# Patient Record
Sex: Female | Born: 1980 | Race: White | Hispanic: No | Marital: Married | Smoking: Current every day smoker
Health system: Southern US, Community
[De-identification: ages and names within clinical notes are randomized; demographics above are authoritative.]

## PROBLEM LIST (undated history)

## (undated) DIAGNOSIS — F419 Anxiety disorder, unspecified: Secondary | ICD-10-CM

## (undated) DIAGNOSIS — F32A Depression, unspecified: Secondary | ICD-10-CM

## (undated) DIAGNOSIS — I10 Essential (primary) hypertension: Secondary | ICD-10-CM

## (undated) DIAGNOSIS — F1010 Alcohol abuse, uncomplicated: Secondary | ICD-10-CM

## (undated) HISTORY — DX: Anxiety disorder, unspecified: F41.9

## (undated) HISTORY — DX: Depression, unspecified: F32.A

## (undated) HISTORY — DX: Essential (primary) hypertension: I10

## (undated) HISTORY — DX: Alcohol abuse, uncomplicated: F10.10

---

## 2020-05-10 ENCOUNTER — Encounter (HOSPITAL_COMMUNITY): Payer: Self-pay | Admitting: Emergency Medicine

## 2020-05-10 ENCOUNTER — Emergency Department (HOSPITAL_COMMUNITY)
Admission: EM | Admit: 2020-05-10 | Discharge: 2020-05-10 | Disposition: A | Discharge status: Home / Self Care | Payer: BC Managed Care – PPO | Attending: Emergency Medicine | Admitting: Emergency Medicine

## 2020-05-10 ENCOUNTER — Emergency Department (HOSPITAL_COMMUNITY): Payer: BC Managed Care – PPO

## 2020-05-10 DIAGNOSIS — R112 Nausea with vomiting, unspecified: Secondary | ICD-10-CM | POA: Insufficient documentation

## 2020-05-10 DIAGNOSIS — R1013 Epigastric pain: Secondary | ICD-10-CM | POA: Diagnosis present

## 2020-05-10 DIAGNOSIS — R1011 Right upper quadrant pain: Secondary | ICD-10-CM | POA: Insufficient documentation

## 2020-05-10 LAB — I-STAT BETA HCG BLOOD, ED (MC, WL, AP ONLY): I-stat hCG, quantitative: <5 m[IU]/mL (ref <5)

## 2020-05-10 LAB — CBC
HCT: 49 % — ABNORMAL HIGH (ref 36.0–46.0)
Hemoglobin: 17.6 g/dL — ABNORMAL HIGH (ref 12.0–15.0)
MCH: 38.3 pg — ABNORMAL HIGH (ref 26.0–34.0)
MCHC: 35.9 g/dL (ref 30.0–36.0)
MCV: 106.5 fL — ABNORMAL HIGH (ref 80.0–100.0)
Platelets: 291 10*3/uL (ref 150–400)
RBC: 4.6 MIL/uL (ref 3.87–5.11)
RDW: 13.2 % (ref 11.5–15.5)
WBC: 8.1 10*3/uL (ref 4.0–10.5)
nRBC: 0.2 % (ref 0.0–0.2)

## 2020-05-10 LAB — COMPREHENSIVE METABOLIC PANEL
ALT: 152 U/L — ABNORMAL HIGH (ref 0–44)
AST: 179 U/L — ABNORMAL HIGH (ref 15–41)
Albumin: 4 g/dL (ref 3.5–5.0)
Alkaline Phosphatase: 81 U/L (ref 38–126)
Anion gap: 17 — ABNORMAL HIGH (ref 5–15)
BUN: 11 mg/dL (ref 6–20)
CO2: 24 mmol/L (ref 22–32)
Calcium: 9.2 mg/dL (ref 8.9–10.3)
Chloride: 101 mmol/L (ref 98–111)
Creatinine, Ser: 0.85 mg/dL (ref 0.44–1.00)
GFR, Estimated: >60 mL/min (ref >60)
Glucose, Bld: 134 mg/dL — ABNORMAL HIGH (ref 70–99)
Potassium: 3.5 mmol/L (ref 3.5–5.1)
Sodium: 142 mmol/L (ref 135–145)
Total Bilirubin: 2 mg/dL — ABNORMAL HIGH (ref 0.3–1.2)
Total Protein: 7.9 g/dL (ref 6.5–8.1)

## 2020-05-10 LAB — HEPATITIS PANEL, ACUTE
HCV Ab: NONREACTIVE
Hep A IgM: NONREACTIVE
Hep B C IgM: NONREACTIVE
Hepatitis B Surface Ag: NONREACTIVE

## 2020-05-10 LAB — LIPASE, BLOOD: Lipase: 35 U/L (ref 11–51)

## 2020-05-10 LAB — ETHANOL: Alcohol, Ethyl (B): <10 mg/dL (ref <10)

## 2020-05-10 LAB — ACETAMINOPHEN LEVEL: Acetaminophen (Tylenol), Serum: <10 ug/mL — ABNORMAL LOW (ref 10–30)

## 2020-05-10 MED ORDER — LACTATED RINGERS IV BOLUS
1000.0000 mL | Freq: Once | INTRAVENOUS | Status: AC
  Administered 2020-05-10: 1000 mL via INTRAVENOUS

## 2020-05-10 MED ORDER — PANTOPRAZOLE SODIUM 20 MG PO TBEC: 20.0000 mg | DELAYED_RELEASE_TABLET | Freq: Every day | ORAL | 1 refills | Status: DC

## 2020-05-10 MED ORDER — ONDANSETRON 4 MG PO TBDP
4.0000 mg | ORAL_TABLET | Freq: Once | ORAL | Status: AC | PRN
  Administered 2020-05-10: 4 mg via ORAL
  Filled 2020-05-10: qty 1

## 2020-05-10 MED ORDER — IOHEXOL 300 MG/ML  SOLN
100.0000 mL | Freq: Once | INTRAMUSCULAR | Status: AC | PRN
  Administered 2020-05-10: 100 mL via INTRAVENOUS

## 2020-05-10 MED ORDER — ONDANSETRON 8 MG PO TBDP: 8.0000 mg | ORAL_TABLET | Freq: Three times a day (TID) | ORAL | 0 refills | Status: AC | PRN

## 2020-05-10 MED ORDER — MORPHINE SULFATE (PF) 4 MG/ML IV SOLN
4.0000 mg | Freq: Once | INTRAVENOUS | Status: AC
  Administered 2020-05-10: 4 mg via INTRAVENOUS
  Filled 2020-05-10: qty 1

## 2020-05-10 MED ORDER — SUCRALFATE 1 G PO TABS: 1.0000 g | ORAL_TABLET | Freq: Three times a day (TID) | ORAL | 0 refills | Status: DC

## 2020-05-10 MED ORDER — FAMOTIDINE 20 MG PO TABS: 20.0000 mg | ORAL_TABLET | Freq: Two times a day (BID) | ORAL | 0 refills | Status: DC

## 2020-05-10 MED ORDER — METOCLOPRAMIDE HCL 5 MG/ML IJ SOLN
10.0000 mg | Freq: Once | INTRAMUSCULAR | Status: AC
  Administered 2020-05-10: 10 mg via INTRAVENOUS
  Filled 2020-05-10: qty 2

## 2020-05-10 NOTE — Discharge Instructions (Addendum)
°  Diet: Start with a clear liquid diet, progressed to a full liquid diet, and then bland solids as you are able. Please adhere to the enclosed dietary suggestions.  In general, avoid NSAIDs (i.e. ibuprofen, naproxen, etc.), caffeine, alcohol, spicy foods, fatty foods, or any other foods that seem to cause your symptoms to arise.  Protonix: Take this medication daily, 20-30 minutes prior to your first meal, for the next 8 weeks.  Continue to take this medication even if you begin to feel better.  Pepcid: Take this medication twice a day for the next 5 days.  Sucralfate: Sucralfate (generic for Carafate) is meant to soothe symptoms of abdominal discomfort and reflux.  Follow-up: Please follow-up with your primary care provider on this matter. You were noted to have mild elevations in your liver enzymes.  Please follow-up on these through a gastroenterologist.  Call to make an appointment.  Return: Return to the ED for significantly worsening symptoms, persistent vomiting, persistent fever, vomiting blood, blood in the stools, dark stools, or any other major concerns.  There are some lab values pending at time of discharge.  These results may be reviewed through online MyChart account.  For prescription assistance, may try using prescription discount sites or apps, such as goodrx.com

## 2020-05-10 NOTE — ED Notes (Signed)
Patient transported to CT 

## 2020-05-10 NOTE — ED Notes (Signed)
Pt in CT.

## 2020-05-10 NOTE — ED Notes (Signed)
Acetaminophen level drawn and sent to lab.

## 2020-05-10 NOTE — ED Triage Notes (Signed)
Patient complains of abdominal pain, nausea, and vomiting, dehydration x2 days. Patient states today her hands started cramping. Patient alert, oriented, and ambulatory at this time.

## 2020-05-10 NOTE — Medical Student Note (Incomplete)
[facility]-EMERGENCY DEPT Provider Student Note For educational purposes for Medical, PA and NP students only and not part of the legal medical record.   CSN: [id number] Arrival date & time: 05/10/20  6222      History   Chief Complaint Chief Complaint  Patient presents with  . Abdominal Pain    HPI Vickie Mccarthy is a 40 y.o. female with PMHx hypertension who presents to ED for complain of abdominal pain.  States that she has been "in and out of [facility]" for a UTI about 1.5 months ago. States she feels like she had the UTI until last week.  She was taking an antibiotic until Monday when she started having abdominal pain. She complains of two days of umbilical and epigastric 10/10 pain. Describes it as heart burn. Endorses nausea, vomiting and diarrhea. Tried tylenol cold and flu without relief of symptoms. She is retching multiple times while examining her. She also endorses decreased urine output. States she urinated a small amount this morning but "that's it over the past day or so." Denies fevers, chest pain, shortness of breath or sick contacts. Denies burning on urination. LMP 1.5 months ago.   Additionally, her other main complaint is severe hand pain. She describes it as 15/10 pain that feels like they "are on fire" and that they are numb. States the numbness radiates up her arms.   Endorses tobacco and marijuana use. No IVDU or other illicit drug abuse. States she drinks "a couple of glasses of wine a night," but has not had alcohol since vomiting has started. Denies withdrawal symptoms but states she has never withdrawn or had withdrawal seizures from alcohol before.   History reviewed. No pertinent past medical history.  There are no problems to display for this patient.   History reviewed. No pertinent surgical history.  OB History   No obstetric history on file.    Home Medications    Prior to Admission medications   Not on File    Family History No family  history on file.  Social History    Allergies   Patient has no known allergies.   Review of Systems Review of Systems  Constitutional: Positive for appetite change.  HENT: Negative for sore throat.   Eyes: Negative for discharge and visual disturbance.  Respiratory: Negative for chest tightness and shortness of breath.   Cardiovascular: Negative for chest pain and palpitations.  Gastrointestinal: Positive for abdominal pain, diarrhea, nausea and vomiting. Negative for abdominal distention and blood in stool.  Endocrine: Negative for polyuria.  Genitourinary: Positive for decreased urine volume. Negative for difficulty urinating, dysuria and flank pain.  Musculoskeletal: Negative.   Skin: Negative.   Allergic/Immunologic: Negative.   Neurological: Positive for numbness. Negative for dizziness, syncope, weakness and light-headedness.       Hands  Hematological: Negative.   Psychiatric/Behavioral: Negative.      Physical Exam Updated Vital Signs BP (!) 131/93 (BP Location: Right Arm)   Pulse 78   Temp 98.6 F (37 C) (Oral)   Resp (!) 22   SpO2 100%   Physical Exam Constitutional:      Appearance: She is ill-appearing.  HENT:     Head: Normocephalic and atraumatic.  Eyes:     Extraocular Movements: Extraocular movements intact.     Pupils: Pupils are equal, round, and reactive to light.  Cardiovascular:     Rate and Rhythm: Normal rate and regular rhythm.     Heart sounds: Normal heart sounds.  Pulmonary:     Effort: Pulmonary effort is normal.  Abdominal:     General: Abdomen is protuberant. Bowel sounds are decreased. There is no distension.     Palpations: Abdomen is soft. There is no shifting dullness.     Tenderness: There is abdominal tenderness in the right upper quadrant and epigastric area. There is no rebound. Positive signs include Murphy's sign.  Skin:    General: Skin is warm and dry.     Capillary Refill: Capillary refill takes less than 2 seconds.   Neurological:     General: No focal deficit present.     Mental Status: She is alert.     Cranial Nerves: Cranial nerves are intact.     Sensory: Sensation is intact.     Motor: Motor function is intact.     Coordination: Coordination is intact.  Psychiatric:        Mood and Affect: Mood is anxious.        Behavior: Behavior is cooperative.    ED Treatments / Results  Labs (all labs ordered are listed, but only abnormal results are displayed) Labs Reviewed  COMPREHENSIVE METABOLIC PANEL - Abnormal; Notable for the following components:      Result Value   Glucose, Bld 134 (*)    AST 179 (*)    ALT 152 (*)    Total Bilirubin 2.0 (*)    Anion gap 17 (*)    All other components within normal limits  CBC - Abnormal; Notable for the following components:   Hemoglobin 17.6 (*)    HCT 49.0 (*)    MCV 106.5 (*)    MCH 38.3 (*)    All other components within normal limits  LIPASE, BLOOD  ETHANOL  URINALYSIS, ROUTINE W REFLEX MICROSCOPIC  HEPATITIS PANEL, ACUTE  ACETAMINOPHEN LEVEL  I-STAT BETA HCG BLOOD, ED ([facility], [facility], [facility] ONLY)    EKG  Radiology US Abdomen Limited RUQ (LIVER/GB)  Result Date: 05/10/2020 CLINICAL DATA:  Epigastric pain EXAM: ULTRASOUND ABDOMEN LIMITED RIGHT UPPER QUADRANT COMPARISON:  None. FINDINGS: Gallbladder: No gallstones or wall thickening visualized. No sonographic Murphy sign noted by sonographer. Common bile duct: Diameter: Normal caliber, 4 mm Liver: Heterogeneous, diffusely increased echotexture suggesting fatty infiltration. Hypoechoic area adjacent to the gallbladder fossa may reflect focal fatty sparing. Portal vein is patent on color Doppler imaging with normal direction of blood flow towards the liver. Other: None. IMPRESSION: Hepatic steatosis. No acute findings. Electronically Signed   By: Charlett Nose M.D.   On: 05/10/2020 10:44   Procedures Procedures (including critical care time)  Medications Ordered in ED Medications  ondansetron  (ZOFRAN-ODT) disintegrating tablet 4 mg (4 mg Oral Given 05/10/20 0830)  metoCLOPramide (REGLAN) injection 10 mg (10 mg Intravenous Given 05/10/20 0944)  morphine 4 MG/ML injection 4 mg (4 mg Intravenous Given 05/10/20 1030)  lactated ringers bolus 1,000 mL (1,000 mLs Intravenous New Bag/Given 05/10/20 1029)   Initial Impression / Assessment and Plan / ED Course  I have reviewed the triage vital signs and the nursing notes.  Pertinent labs & imaging results that were available during my care of the patient were reviewed by me and considered in my medical decision making (see chart for details).  Vickie Mccarthy is a 39yoF with PMH and HPI listed above. Differential diagnosis is broad includes pancreatitis, PUD, cholecystitis, gastroenteritis, alcohol withdrawal.  [place] RUQ, CBC, CMP, lipase, beta hcg, ethanol, UA.  [place] RUQ negative for gallstones. Hepatic steatosis noted. WBC wnl, LFTs elevated,  T.bili 2, Anion gap 17, bicarb 24  Ethanol <10   Final Clinical Impressions(s) / ED Diagnoses   Final diagnoses:  None    New Prescriptions New Prescriptions   No medications on file

## 2020-05-10 NOTE — ED Provider Notes (Signed)
[facility]   CSN: [id number] Arrival date & time: 05/10/20  [id number]     History Chief Complaint  Patient presents with  . Abdominal Pain    Vickie Mccarthy is a 40 y.o. female.  HPI      Vickie Mccarthy is a 40 y.o. female, with a history of EtOH abuse, presenting to the ED with abdominal pain for the last couple days. Pain is primarily epigastric, waxing and waning, sharp, currently 6/10, occasionally radiating toward the right upper quadrant. Pain is accompanied by nausea and nonbloody nonbilious vomiting.  Patient endorses recent treatment for UTI with an antibiotic "that starts with a C."  Her last dose of her latest antibiotic was Monday, February 14. Patient states she drinks about 2 glasses of wine daily.  Last alcohol intake was Sunday, February 13. Denies fever/chills, chest pain, shortness of breath, other abdominal pain, diarrhea, hematochezia/melena, flank/back pain, syncope, or any other complaints.  History reviewed. No pertinent past medical history.  There are no problems to display for this patient.   History reviewed. No pertinent surgical history.   OB History   No obstetric history on file.     No family history on file.  Social History   Tobacco Use  . Smoking status: Unknown If Ever Smoked  Substance Use Topics  . Alcohol use: Yes    Alcohol/week: 14.0 standard drinks    Types: 14 Glasses of wine per week    Home Medications Prior to Admission medications   Medication Sig Start Date End Date Taking? Authorizing Provider  famotidine (PEPCID) 20 MG tablet Take 1 tablet (20 mg total) by mouth 2 (two) times daily for 5 days. 05/10/20 05/15/20 Yes Joy, Shawn C, PA-C  ondansetron (ZOFRAN ODT) 8 MG disintegrating tablet Take 1 tablet (8 mg total) by mouth every 8 (eight) hours as needed for nausea or vomiting. 05/10/20  Yes Joy, Shawn C, PA-C  pantoprazole (PROTONIX) 20 MG tablet Take 1 tablet (20  mg total) by mouth daily. 05/10/20 07/09/20 Yes Joy, Shawn C, PA-C  sucralfate (CARAFATE) 1 g tablet Take 1 tablet (1 g total) by mouth 4 (four) times daily -  with meals and at bedtime for 15 days. 05/10/20 05/25/20 Yes Joy, Shawn C, PA-C    Allergies    Patient has no known allergies.  Review of Systems   Review of Systems  Constitutional: Negative for chills, diaphoresis and fever.  Respiratory: Negative for cough and shortness of breath.   Cardiovascular: Negative for chest pain.  Gastrointestinal: Positive for abdominal pain, nausea and vomiting. Negative for blood in stool and diarrhea.  Musculoskeletal: Negative for back pain.  Neurological: Negative for syncope and weakness.  All other systems reviewed and are negative.   Physical Exam Updated Vital Signs BP (!) 131/93 (BP Location: Right Arm)   Pulse 78   Temp 98.6 F (37 C) (Oral)   Resp (!) 22   SpO2 100%   Physical Exam Vitals and nursing note reviewed.  Constitutional:      General: She is not in acute distress.    Appearance: She is well-developed. She is not diaphoretic.  HENT:     Head: Normocephalic and atraumatic.     Mouth/Throat:     Mouth: Mucous membranes are moist.     Pharynx: Oropharynx is clear.  Eyes:     Conjunctiva/sclera: Conjunctivae normal.  Cardiovascular:     Rate and Rhythm: Normal rate and regular rhythm.  Pulses: Normal pulses.          Radial pulses are 2+ on the right side and 2+ on the left side.  Pulmonary:     Effort: Pulmonary effort is normal. No respiratory distress.  Abdominal:     Palpations: Abdomen is soft.     Tenderness: There is abdominal tenderness in the right upper quadrant and epigastric area. There is guarding.     Comments: Majority of her abdominal tenderness is epigastric with some RUQ tenderness as well.  Musculoskeletal:     Cervical back: Neck supple.  Skin:    General: Skin is warm and dry.  Neurological:     Mental Status: She is alert.  Psychiatric:         Mood and Affect: Mood and affect normal.        Speech: Speech normal.        Behavior: Behavior normal.     ED Results / Procedures / Treatments   Labs (all labs ordered are listed, but only abnormal results are displayed) Labs Reviewed  COMPREHENSIVE METABOLIC PANEL - Abnormal; Notable for the following components:      Result Value   Glucose, Bld 134 (*)    AST 179 (*)    ALT 152 (*)    Total Bilirubin 2.0 (*)    Anion gap 17 (*)    All other components within normal limits  CBC - Abnormal; Notable for the following components:   Hemoglobin 17.6 (*)    HCT 49.0 (*)    MCV 106.5 (*)    MCH 38.3 (*)    All other components within normal limits  ACETAMINOPHEN LEVEL - Abnormal; Notable for the following components:   Acetaminophen (Tylenol), Serum <10 (*)    All other components within normal limits  LIPASE, BLOOD  ETHANOL  HEPATITIS PANEL, ACUTE  URINALYSIS, ROUTINE W REFLEX MICROSCOPIC  I-STAT BETA HCG BLOOD, ED ([facility], [facility], [facility] ONLY)    EKG None  Radiology CT ABDOMEN PELVIS W CONTRAST  Result Date: 05/10/2020 CLINICAL DATA:  Abdominal pain, nausea, vomiting and dehydration for 2 days EXAM: CT ABDOMEN AND PELVIS WITH CONTRAST TECHNIQUE: Multidetector CT imaging of the abdomen and pelvis was performed using the standard protocol following bolus administration of intravenous contrast. CONTRAST:  OMNIPAQUE IOHEXOL 300 MG/ML  SOLN COMPARISON:  None. FINDINGS: Lower chest: No acute abnormality. Hepatobiliary: No solid liver abnormality is seen. Hepatic steatosis. No gallstones, gallbladder wall thickening, or biliary dilatation. Pancreas: Unremarkable. No pancreatic ductal dilatation or surrounding inflammatory changes. Spleen: Normal in size without significant abnormality. Adrenals/Urinary Tract: Adrenal glands are unremarkable. Multiple small bilateral renal calculi. 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 No  hydronephrosis. Bladder is unremarkable. Stomach/Bowel: Stomach is within normal limits. Appendix appears normal. No evidence of bowel wall thickening, distention, or inflammatory changes. Vascular/Lymphatic: No significant vascular findings are present. No enlarged abdominal or pelvic lymph nodes. Reproductive: No mass or other significant abnormality. Functional ovarian cysts and follicles. Other: No abdominal wall hernia or abnormality. No abdominopelvic ascites. Musculoskeletal: No acute or significant osseous findings. IMPRESSION: 1. No acute CT findings of the abdomen or pelvis to explain abdominal pain, nausea, vomiting, or dehydration. 2. Hepatic steatosis. 3. Multiple small bilateral renal calculi. No hydronephrosis. 4. 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 This could be further evaluated on a nonemergent basis by CT urogram including delayed phase imaging if desired. Electronically Signed  By: Lauralyn Primes M.D.   On: 05/10/2020 12:11   US Abdomen Limited RUQ (LIVER/GB)  Result Date: 05/10/2020 CLINICAL DATA:  Epigastric pain EXAM: ULTRASOUND ABDOMEN LIMITED RIGHT UPPER QUADRANT COMPARISON:  None. FINDINGS: Gallbladder: No gallstones or wall thickening visualized. No sonographic Murphy sign noted by sonographer. Common bile duct: Diameter: Normal caliber, 4 mm Liver: Heterogeneous, diffusely increased echotexture suggesting fatty infiltration. Hypoechoic area adjacent to the gallbladder fossa may reflect focal fatty sparing. Portal vein is patent on color Doppler imaging with normal direction of blood flow towards the liver. Other: None. IMPRESSION: Hepatic steatosis. No acute findings. Electronically Signed   By: Charlett Nose M.D.   On: 05/10/2020 10:44    Procedures Procedures   Medications Ordered in ED Medications  ondansetron (ZOFRAN-ODT) disintegrating tablet 4 mg (4 mg Oral Given 05/10/20 0830)  metoCLOPramide (REGLAN) injection 10 mg  (10 mg Intravenous Given 05/10/20 0944)  morphine 4 MG/ML injection 4 mg (4 mg Intravenous Given 05/10/20 1030)  lactated ringers bolus 1,000 mL (0 mLs Intravenous Stopped 05/10/20 1130)  iohexol (OMNIPAQUE) 300 MG/ML solution 100 mL (100 mLs Intravenous Contrast Given 05/10/20 1152)    ED Course  I have reviewed the triage vital signs and the nursing notes.  Pertinent labs & imaging results that were available during my care of the patient were reviewed by me and considered in my medical decision making (see chart for details).    MDM Rules/Calculators/A&P                          Patient presents with abdominal pain nausea and vomiting. Patient is nontoxic appearing, afebrile, not tachycardic, not tachypneic, not hypotensive, maintains excellent SPO2 on room air.   I have reviewed the patient's chart to obtain more information.   I reviewed and interpreted the patient's labs and radiological studies. She does have some mild elevations in her AST, ALT, and total bilirubin. Full hepatitis panel results pending at time of discharge.  Patient advised to follow-up with GI on this matter. Pain fully controlled and patient tolerating PO at time of discharge. The patient was given instructions for home care as well as return precautions. Patient voices understanding of these instructions, accepts the plan, and is comfortable with discharge.    Patient denies diaphoresis, hallucinations, seizures, syncope, headaches, anxiety/agitation, confusion.  No tachycardia or hypertension.  I do not think the patient is in severe withdrawal at this time requiring hospitalization. We discussed her alcohol use, its risks, and plan for possible cessation.  Patient states she is almost ready to work on cessation.  She has a PCP appointment next week and will discuss this at that time.   Vitals:   05/10/20 1245 05/10/20 1300 05/10/20 1315 05/10/20 1330  BP: 111/81 114/83 102/68 119/84  Pulse: 70 61 (!) 55  (!) 59  Resp:  18    Temp:    (!) 97.5 F (36.4 C)  TempSrc:      SpO2: 98% 96% 97% 95%     Final Clinical Impression(s) / ED Diagnoses Final diagnoses:  Epigastric pain    Rx / DC Orders ED Discharge Orders         Ordered    ondansetron (ZOFRAN ODT) 8 MG disintegrating tablet  Every 8 hours PRN        05/10/20 1318    pantoprazole (PROTONIX) 20 MG tablet  Daily        05/10/20 1318    famotidine (  PEPCID) 20 MG tablet  2 times daily        05/10/20 1318    sucralfate (CARAFATE) 1 g tablet  3 times daily with meals & bedtime        05/10/20 1318           Anselm Pancoast, PA-C 05/10/20 1520    Linwood Dibbles, MD 05/11/20 [phone]

## 2020-05-10 NOTE — ED Notes (Signed)
Patient transported to Ultrasound 

## 2020-05-24 ENCOUNTER — Encounter: Payer: Self-pay | Admitting: Nurse Practitioner

## 2020-06-07 ENCOUNTER — Encounter: Payer: Self-pay | Admitting: Nurse Practitioner

## 2020-06-07 ENCOUNTER — Ambulatory Visit (INDEPENDENT_AMBULATORY_CARE_PROVIDER_SITE_OTHER): Payer: BC Managed Care – PPO | Admitting: Nurse Practitioner

## 2020-06-07 ENCOUNTER — Other Ambulatory Visit (INDEPENDENT_AMBULATORY_CARE_PROVIDER_SITE_OTHER): Payer: BC Managed Care – PPO

## 2020-06-07 VITALS — BP 126/88 | HR 96 | Ht <= 58 in | Wt 147.8 lb

## 2020-06-07 DIAGNOSIS — R1013 Epigastric pain: Secondary | ICD-10-CM | POA: Diagnosis not present

## 2020-06-07 DIAGNOSIS — R7989 Other specified abnormal findings of blood chemistry: Secondary | ICD-10-CM

## 2020-06-07 DIAGNOSIS — R112 Nausea with vomiting, unspecified: Secondary | ICD-10-CM

## 2020-06-07 LAB — HEPATIC FUNCTION PANEL
ALT: 91 U/L — ABNORMAL HIGH (ref 0–35)
AST: 83 U/L — ABNORMAL HIGH (ref 0–37)
Albumin: 4.4 g/dL (ref 3.5–5.2)
Alkaline Phosphatase: 58 U/L (ref 39–117)
Bilirubin, Direct: 0.1 mg/dL (ref 0.0–0.3)
Total Bilirubin: 0.5 mg/dL (ref 0.2–1.2)
Total Protein: 8 g/dL (ref 6.0–8.3)

## 2020-06-07 NOTE — Progress Notes (Addendum)
ASSESSMENT AND PLAN    #40 year old female with recurring episodes of nausea, vomiting and epigastric pain over the last two years.  Evaluated in ED mid February for the worst episode she has had yet.  Labs basically unremarkable except for elevated liver chemistries (  AST 179 / ALT 152, Bilirubin 2.0).  RUQ ultrasound and CT scan negative for hepatobiliary findings other than steatosis.  CBD 4 mm.  Etiology of these intermittent symptoms is unclear but at this point doesn't seem hepatobiliary based on imaging. Symptoms have resolved for now --EGD to evaluate for upper GI tract pathology. The risks and benefits of EGD were discussed and the patient agrees to proceed.  --Avoid NSAIDs for now --Continue famotidine --AST/ALT ratio atypical for EtOH, could be multifactorial ( NSAIDs, Etoh).  No evidence for cholelithiasis/choledocholithiasis.  --Viral hepatitis panel negative --Repeat liver chemistries today.  She has not consumed alcohol in a week.  Hopefully her liver chemistries have improved but will follow them out to normalization. If they stay elevated then will need hepatic serologic work-up.   --No ETOH  # GERD, only occasional symptoms treated withTums as needed.    HISTORY OF PRESENT ILLNESS     Chief Complaint : abdominal pain , nausea and vomiting  Vickie Mccarthy is a 40 y.o. female , new to the practice, with a history of alcohol abuse, depression, kidney stones, HTN, C-section x3   Patient evaluated in ED 05/10/20 for nausea, vomiting, and epigastric pain radiating upward into her chest. She has had these same symptoms intermittently over the last couple of years. Generally symptoms are transient but after no improvement in 2 days patient went to the ED. Evaluation showed elevated liver chemistries. Imaging unremarkable.   Etoh level <  ,10 Acute hep panel negative Pregnancy test negative  WBC was normal Hgb 17.6 ( smoker), MCV 106 Renal function normal AST 179 / ALT  152, Bilirubin 2.0 Lipase 35  RUQ US -hepatic steatosis  CT scan  abd/ pelvis with contrast IMPRESSION: 1. No acute CT findings of the abdomen or pelvis to explain abdominal pain, nausea, vomiting, or dehydration. 2. Hepatic steatosis. 3. Multiple small bilateral renal calculi. No hydronephrosis. 4. 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 This could be further evaluated on a nonemergent basis by CT urogram including delayed phase imaging if desired.  She was discharged home from the ED with Zofran, Protonix daily, twice daily Pepcid and Carafate 4 times daily . Symptoms slowly improved over the following two weeks.    Regarding elevated liver chemistries, no recent antibiotics ( last ones were in the fall), no recent medications changes prior to ED visit.   There are no liver chemistries in epic to compare.  Patient does not recall ever being told she had any liver abnormalities on prior blood work.  She takes Tylenol and Ibuprofen as needed.  In a month's time she may take 20 Tylenol and 10 ibuprofen.  No FMH of liver disease.   Patient has a history of heavy alcohol use since 07/17/2008 when her brother passed away . She started Anabuse last Friday and hasn't had any EtOH since  Patient has a history of occasional alternating bowels but currently having normal BMs. No blood in stool.  No FMH of colon cancer.   Past Medical History:  Diagnosis Date  . Alcohol abuse   . Anxiety   . Depression   . Hypertension  Past Surgical History:  Procedure Laterality Date  . CESAREAN SECTION     Family History  Problem Relation Age of Onset  . Stomach cancer Maternal Grandmother   . Aneurysm Father        abdominal aortic aneurysm  . Diverticulitis Father   . Colon cancer Neg Hx   . Liver disease Neg Hx   . Esophageal cancer Neg Hx   . Pancreatic cancer Neg Hx    Social History   Tobacco Use  . Smoking status: Current Every Day  Smoker    Types: Cigarettes  . Smokeless tobacco: Never Used  Vaping Use  . Vaping Use: Never used  Substance Use Topics  . Alcohol use: Not Currently    Comment: stopped 1 week ago  . Drug use: Yes    Types: Marijuana    Comment: "socially"   Current Outpatient Medications  Medication Sig Dispense Refill  . acetaminophen (TYLENOL) 500 MG tablet Take 500 mg by mouth every 6 (six) hours as needed.    Marland Kitchen amLODipine (NORVASC) 5 MG tablet Take 5 mg by mouth daily.    Marland Kitchen buPROPion (WELLBUTRIN SR) 150 MG 12 hr tablet Take 150 mg by mouth daily.    . diphenhydrAMINE (BENADRYL) 25 MG tablet Take 25 mg by mouth every 6 (six) hours as needed.    . disulfiram (ANTABUSE) 250 MG tablet Take 250 mg by mouth daily.    . famotidine (PEPCID) 20 MG tablet Take 20 mg by mouth daily. May take BID if symptomatic    . hydrochlorothiazide (HYDRODIURIL) 25 MG tablet Take 25 mg by mouth daily.    Marland Kitchen ibuprofen (ADVIL) 400 MG tablet Take 400 mg by mouth every 6 (six) hours as needed.    . Melatonin 10 MG CAPS Take 10 mg by mouth at bedtime.    . ondansetron (ZOFRAN ODT) 8 MG disintegrating tablet Take 1 tablet (8 mg total) by mouth every 8 (eight) hours as needed for nausea or vomiting. 20 tablet 0  . sucralfate (CARAFATE) 1 g tablet Take 1 g by mouth 4 (four) times daily.     No current facility-administered medications for this visit.   Allergies  Allergen Reactions  . Lisinopril Anaphylaxis    Facial swelling and throat swelling     Review of Systems: Positive for anxiety, allergy, sinus trouble, depression, menstrual . All systems reviewed and negative except where noted in HPI.   PHYSICAL EXAM :    Wt Readings from Last 3 Encounters:  06/07/20 147 lb 12.8 oz (67 kg)    BP 126/88   Pulse 96   Ht 4\' 10"  (1.473 m)   Wt 147 lb 12.8 oz (67 kg)   SpO2 98%   BMI 30.89 kg/m  Constitutional:  Pleasant female in no acute distress. Psychiatric: Normal mood and affect. Behavior is normal. EENT:  Pupils normal.  Conjunctivae are normal. No scleral icterus. Neck supple.  Cardiovascular: Normal rate, regular rhythm. No edema Pulmonary/chest: Effort normal and breath sounds normal. No wheezing, rales or rhonchi. Abdominal: Soft, nondistended, nontender. Bowel sounds active throughout. There are no masses palpable. No hepatomegaly. Neurological: Alert and oriented to person place and time. Skin: Skin is warm and dry. No rashes noted.  , NP  06/07/2020, 2:46 PM

## 2020-06-07 NOTE — Patient Instructions (Signed)
It was a pleasure to meet you today. Based on our discussion, I am providing you with my recommendations below:  RECOMMENDATION(S):   . Your provider has requested that you go to the basement level for lab work before leaving today. Press "B" on the elevator. The lab is located at the first door on the left as you exit the elevator.  . Continue Pepcid.   You have been scheduled for an endoscopy. Please follow written instructions given to you at your visit today. If you use inhalers (even only as needed), please bring them with you on the day of your procedure.   BMI:  . If you are age 8 or older, your body mass index should be between 23-30. Your Body mass index is 30.89 kg/m. If this is out of the aforementioned range listed, please consider follow up with your Primary Care Provider.  . If you are age 25 or younger, your body mass index should be between 19-25. Your Body mass index is 30.89 kg/m. If this is out of the aformentioned range listed, please consider follow up with your Primary Care Provider.   Thank you for trusting me with your gastrointestinal care!    Willette Cluster, NP

## 2020-06-08 NOTE — Progress Notes (Signed)
Attending Physician's Attestation   I have reviewed the chart.   I agree with the Advanced Practitioner's note, impression, and recommendations with any updates as below. Agree with repeating LFTs and seeing how things are going from there.  If issues persist reasonable to evaluate endoscopically for any issues for symptoms.  May consider additional workup from a liver serology perspective +/- MRI imaging or EUS in future and if LFTs remain abnormal for >6 months or progressively worsen may need liver biopsy.   Corliss Parish, MD  Gastroenterology Advanced Endoscopy Office # [phone]

## 2020-06-15 ENCOUNTER — Other Ambulatory Visit: Payer: Self-pay

## 2020-06-15 DIAGNOSIS — R7989 Other specified abnormal findings of blood chemistry: Secondary | ICD-10-CM

## 2020-07-16 ENCOUNTER — Other Ambulatory Visit: Payer: Self-pay

## 2020-08-08 ENCOUNTER — Telehealth: Payer: Self-pay | Admitting: Gastroenterology

## 2020-08-08 NOTE — Telephone Encounter (Signed)
First time she has canceled.  No late cancellation fee at this time.  Place recall for 3 months into the system in case she has not called to reschedule. Any additional cancellations will accrue a late fee cancellation. Thanks. GM

## 2020-08-08 NOTE — Telephone Encounter (Signed)
Good evening,   Inbound call from patient. Cancel EDG procedure for 5/19. Unable to get off work. Will call back to reschedule.

## 2020-08-08 NOTE — Telephone Encounter (Signed)
3 month recall put in

## 2020-08-09 ENCOUNTER — Encounter: Payer: Self-pay | Admitting: Gastroenterology

## 2020-08-14 ENCOUNTER — Other Ambulatory Visit: Payer: Self-pay

## 2020-08-14 ENCOUNTER — Encounter (HOSPITAL_COMMUNITY): Payer: Self-pay | Admitting: Emergency Medicine

## 2020-08-14 ENCOUNTER — Emergency Department (HOSPITAL_COMMUNITY)
Admission: EM | Admit: 2020-08-14 | Discharge: 2020-08-15 | Disposition: A | Discharge status: Home / Self Care | Payer: BC Managed Care – PPO | Attending: Emergency Medicine | Admitting: Emergency Medicine

## 2020-08-14 DIAGNOSIS — R5383 Other fatigue: Secondary | ICD-10-CM | POA: Insufficient documentation

## 2020-08-14 DIAGNOSIS — I10 Essential (primary) hypertension: Secondary | ICD-10-CM | POA: Diagnosis not present

## 2020-08-14 DIAGNOSIS — Z79899 Other long term (current) drug therapy: Secondary | ICD-10-CM | POA: Insufficient documentation

## 2020-08-14 DIAGNOSIS — F17210 Nicotine dependence, cigarettes, uncomplicated: Secondary | ICD-10-CM | POA: Insufficient documentation

## 2020-08-14 DIAGNOSIS — Z20822 Contact with and (suspected) exposure to covid-19: Secondary | ICD-10-CM | POA: Insufficient documentation

## 2020-08-14 LAB — CBG MONITORING, ED: Glucose-Capillary: 109 mg/dL — ABNORMAL HIGH (ref 70–99)

## 2020-08-14 MED ORDER — SODIUM CHLORIDE 0.9 % IV BOLUS
1000.0000 mL | Freq: Once | INTRAVENOUS | Status: AC
  Administered 2020-08-15: 1000 mL via INTRAVENOUS

## 2020-08-14 MED ORDER — SODIUM CHLORIDE 0.9 % IV SOLN: INTRAVENOUS | Status: DC

## 2020-08-14 MED ORDER — LORAZEPAM 2 MG/ML IJ SOLN: 4.0000 mg | INTRAMUSCULAR | Status: DC | PRN

## 2020-08-14 NOTE — ED Provider Notes (Signed)
Emergency Medicine Provider Triage Evaluation Note  Vickie Mccarthy , a 40 y.o. female  was evaluated in triage.  Pt complains of with chief complaint of seizures.  Was seen 5 days ago at [facility] for seizure like activity.  No prior hx of seizures.  States that she hasn't been out bed since.  Denies drug or alcohol use.   Review of Systems  Positive: Fatigue, seizure Negative: Fever  Physical Exam  BP (!) 135/99 (BP Location: Right Arm)   Pulse 85   Temp 97.7 F (36.5 C) (Oral)   Resp 16   Ht 4\' 10"  (1.473 m)   Wt 70 kg   LMP 07/11/2020   SpO2 97%   BMI 32.25 kg/m  Gen:   Awake, no distress   Resp:  Normal effort  MSK:   Moves extremities without difficulty    Medical Decision Making  Medically screening exam initiated at 11:25 PM.  Appropriate orders placed.  Vickie Mccarthy was informed that the remainder of the evaluation will be completed by another provider, this initial triage assessment does not replace that evaluation, and the importance of remaining in the ED until their evaluation is complete.     07/13/2020, PA-C 08/14/20 2328    Mesner, 2329, MD 08/15/20 0200

## 2020-08-14 NOTE — ED Triage Notes (Signed)
Patient reports seizure episode at work last Thursday , patient stated feeling "sleepy", tired/fatigue after the seizure .

## 2020-08-15 LAB — BASIC METABOLIC PANEL
Anion gap: 12 (ref 5–15)
BUN: 16 mg/dL (ref 6–20)
CO2: 20 mmol/L — ABNORMAL LOW (ref 22–32)
Calcium: 9.9 mg/dL (ref 8.9–10.3)
Chloride: 103 mmol/L (ref 98–111)
Creatinine, Ser: 0.69 mg/dL (ref 0.44–1.00)
GFR, Estimated: >60 mL/min (ref >60)
Glucose, Bld: 90 mg/dL (ref 70–99)
Potassium: 3.4 mmol/L — ABNORMAL LOW (ref 3.5–5.1)
Sodium: 135 mmol/L (ref 135–145)

## 2020-08-15 LAB — CBC WITH DIFFERENTIAL/PLATELET
Abs Immature Granulocytes: 0.04 10*3/uL (ref 0.00–0.07)
Basophils Absolute: 0.1 10*3/uL (ref 0.0–0.1)
Basophils Relative: 1 %
Eosinophils Absolute: 0.2 10*3/uL (ref 0.0–0.5)
Eosinophils Relative: 2 %
HCT: 43.5 % (ref 36.0–46.0)
Hemoglobin: 15 g/dL (ref 12.0–15.0)
Immature Granulocytes: 0 %
Lymphocytes Relative: 25 %
Lymphs Abs: 2.6 10*3/uL (ref 0.7–4.0)
MCH: 33.3 pg (ref 26.0–34.0)
MCHC: 34.5 g/dL (ref 30.0–36.0)
MCV: 96.5 fL (ref 80.0–100.0)
Monocytes Absolute: 0.7 10*3/uL (ref 0.1–1.0)
Monocytes Relative: 7 %
Neutro Abs: 6.6 10*3/uL (ref 1.7–7.7)
Neutrophils Relative %: 65 %
Platelets: 363 10*3/uL (ref 150–400)
RBC: 4.51 MIL/uL (ref 3.87–5.11)
RDW: 12.4 % (ref 11.5–15.5)
WBC: 10.2 10*3/uL (ref 4.0–10.5)
nRBC: 0 % (ref 0.0–0.2)

## 2020-08-15 LAB — URINALYSIS, ROUTINE W REFLEX MICROSCOPIC
Bilirubin Urine: NEGATIVE
Glucose, UA: NEGATIVE mg/dL
Hgb urine dipstick: NEGATIVE
Ketones, ur: 80 mg/dL — AB
Leukocytes,Ua: NEGATIVE
Nitrite: NEGATIVE
Protein, ur: 100 mg/dL — AB
Specific Gravity, Urine: 1.015 (ref 1.005–1.030)
pH: 7 (ref 5.0–8.0)

## 2020-08-15 LAB — RAPID URINE DRUG SCREEN, HOSP PERFORMED
Amphetamines: NOT DETECTED
Barbiturates: NOT DETECTED
Benzodiazepines: NOT DETECTED
Cocaine: NOT DETECTED
Opiates: NOT DETECTED
Tetrahydrocannabinol: POSITIVE — AB

## 2020-08-15 LAB — RESP PANEL BY RT-PCR (FLU A&B, COVID) ARPGX2
Influenza A by PCR: NEGATIVE
Influenza B by PCR: NEGATIVE
SARS Coronavirus 2 by RT PCR: NEGATIVE

## 2020-08-15 LAB — ETHANOL: Alcohol, Ethyl (B): <10 mg/dL (ref <10)

## 2020-08-15 LAB — I-STAT BETA HCG BLOOD, ED (MC, WL, AP ONLY): I-stat hCG, quantitative: <5 m[IU]/mL (ref <5)

## 2020-08-15 NOTE — Discharge Instructions (Addendum)
Please follow up with your doctor for further outpatient evaluation and management of persistent fatigue.

## 2020-08-15 NOTE — ED Notes (Signed)
Pt discharged and ambulated out of the ED without difficulty. 

## 2020-08-15 NOTE — ED Provider Notes (Signed)
[facility] EMERGENCY DEPARTMENT Provider Note   CSN: [id number] Arrival date & time: 08/14/20  2316     History Chief Complaint  Patient presents with  . Seizures    Vickie Mccarthy is a 40 y.o. female.  Patient to ED for evaluation of extreme fatigue. She is a vague historian. She states she is here because her grandmother doesn't feel she is taking care of herself. [facility] after passing out at work, possible seizure activity. The patient cannot contribute more information. She denies any recurrent LOC. She states that since that evaluation she has been lying in bed, unable to do much of anything because of fatigue. No fever. She reports nausea without vomiting. No significant cough, SOB.   The history is provided by the patient. No language interpreter was used.       Past Medical History:  Diagnosis Date  . Alcohol abuse   . Anxiety   . Depression   . Hypertension     There are no problems to display for this patient.   Past Surgical History:  Procedure Laterality Date  . CESAREAN SECTION       OB History   No obstetric history on file.     Family History  Problem Relation Age of Onset  . Stomach cancer Maternal Grandmother   . Aneurysm Father        abdominal aortic aneurysm  . Diverticulitis Father   . Colon cancer Neg Hx   . Liver disease Neg Hx   . Esophageal cancer Neg Hx   . Pancreatic cancer Neg Hx     Social History   Tobacco Use  . Smoking status: Current Every Day Smoker    Types: Cigarettes  . Smokeless tobacco: Never Used  Vaping Use  . Vaping Use: Never used  Substance Use Topics  . Alcohol use: Not Currently    Comment: stopped 1 week ago  . Drug use: Yes    Types: Marijuana    Comment: "socially"    Home Medications Prior to Admission medications   Medication Sig Start Date End Date Taking? Authorizing Provider  acetaminophen (TYLENOL) 500 MG tablet Take 500 mg by mouth every 6 (six)  hours as needed.    [provider]  amLODipine (NORVASC) 5 MG tablet Take 5 mg by mouth daily.    [provider]  buPROPion (WELLBUTRIN SR) 150 MG 12 hr tablet Take 150 mg by mouth daily.    [provider]  diphenhydrAMINE (BENADRYL) 25 MG tablet Take 25 mg by mouth every 6 (six) hours as needed.    [provider]  disulfiram (ANTABUSE) 250 MG tablet Take 250 mg by mouth daily.    [provider]  famotidine (PEPCID) 20 MG tablet Take 20 mg by mouth daily. May take BID if symptomatic    [provider]  hydrochlorothiazide (HYDRODIURIL) 25 MG tablet Take 25 mg by mouth daily.    [provider]  ibuprofen (ADVIL) 400 MG tablet Take 400 mg by mouth every 6 (six) hours as needed.    [provider]  Melatonin 10 MG CAPS Take 10 mg by mouth at bedtime.    [provider]  ondansetron (ZOFRAN ODT) 8 MG disintegrating tablet Take 1 tablet (8 mg total) by mouth every 8 (eight) hours as needed for nausea or vomiting. 05/10/20   Joy, Shawn C, PA-C  sucralfate (CARAFATE) 1 g tablet Take 1 g by mouth 4 (  four) times daily.    [provider]    Allergies    Lisinopril  Review of Systems   Review of Systems  Constitutional: Positive for fatigue. Negative for chills and fever.  HENT: Negative.   Respiratory: Negative.  Negative for cough.   Cardiovascular: Negative.  Negative for chest pain.  Gastrointestinal: Positive for nausea. Negative for vomiting.  Genitourinary: Negative for dysuria.  Musculoskeletal: Negative.   Skin: Negative.   Neurological: Negative.  Negative for seizures and syncope.    Physical Exam Updated Vital Signs BP (!) 132/94 (BP Location: Left Arm)   Pulse 79   Temp 98.3 F (36.8 C) (Oral)   Resp 15   Ht 4\' 10"  (1.473 m)   Wt 70 kg   LMP 07/11/2020   SpO2 100%   BMI 32.25 kg/m   Physical Exam Vitals and nursing note reviewed.  Constitutional:      Appearance: She is  well-developed. She is not ill-appearing.  HENT:     Head: Normocephalic.  Cardiovascular:     Rate and Rhythm: Normal rate and regular rhythm.     Heart sounds: No murmur heard.   Pulmonary:     Effort: Pulmonary effort is normal.     Breath sounds: Normal breath sounds. No wheezing, rhonchi or rales.  Abdominal:     General: Bowel sounds are normal.     Palpations: Abdomen is soft.     Tenderness: There is no abdominal tenderness. There is no guarding or rebound.  Musculoskeletal:        General: Normal range of motion.     Cervical back: Normal range of motion and neck supple.  Skin:    General: Skin is warm and dry.     Findings: No rash.  Neurological:     Mental Status: She is alert and oriented to person, place, and time.     Comments: Fatigued but wakes fully.     ED Results / Procedures / Treatments   Labs (all labs ordered are listed, but only abnormal results are displayed) Labs Reviewed  BASIC METABOLIC PANEL - Abnormal; Notable for the following components:      Result Value   Potassium 3.4 (*)    CO2 20 (*)    All other components within normal limits  CBG MONITORING, ED - Abnormal; Notable for the following components:   Glucose-Capillary 109 (*)    All other components within normal limits  RESP PANEL BY RT-PCR (FLU A&B, COVID) ARPGX2  CBC WITH DIFFERENTIAL/PLATELET  URINALYSIS, ROUTINE W REFLEX MICROSCOPIC  RAPID URINE DRUG SCREEN, HOSP PERFORMED  ETHANOL  I-STAT BETA HCG BLOOD, ED ([facility], [facility], [facility] ONLY)    EKG None  Radiology No results found.  Procedures Procedures   Medications Ordered in ED Medications  sodium chloride 0.9 % bolus 1,000 mL (1,000 mLs Intravenous New Bag/Given 08/15/20 0315)    And  0.9 %  sodium chloride infusion (has no administration in time range)  LORazepam (ATIVAN) injection 4 mg (has no administration in time range)    ED Course  I have reviewed the triage vital signs and the nursing notes.  Pertinent labs &  imaging results that were available during my care of the patient were reviewed by me and considered in my medical decision making (see chart for details).    MDM Rules/Calculators/A&P  Patient to ED with c/o excessive fatigue that started after syncopal episode/possible seizure 5/19, seen at [facility] with negative work up, discharged home to PCP follow up.   She has not arranged follow up with PCP. No new symptoms. No vomiting. No recurrent syncope or seizure.   VSS. CBG 109. RVP negative. No leukocytosis, anemia, electrolyte abnormality. She has been seen and evaluated by Dr. Bebe Shaggy. She stated a concern to him regarding her blood pressure medications, however, no concerning changes in blood pressure for duration of visit this evening. Medications reviewed.   She can be discharged home and is encouraged to follow up with PCP for further evaluation and management.  Final Clinical Impression(s) / ED Diagnoses Final diagnoses:  None   1. Fatigue   Rx / DC Orders ED Discharge Orders    None       Danne Harbor 08/15/20 [id number]    Marily Memos, MD 08/16/20 [phone]

## 2020-10-26 ENCOUNTER — Encounter: Payer: Self-pay | Admitting: Gastroenterology

## 2021-05-27 ENCOUNTER — Other Ambulatory Visit: Payer: Self-pay

## 2021-05-27 ENCOUNTER — Emergency Department
Admission: EM | Admit: 2021-05-27 | Discharge: 2021-05-27 | Disposition: A | Discharge status: Home / Self Care | Payer: BC Managed Care – PPO | Attending: Emergency Medicine | Admitting: Emergency Medicine

## 2021-05-27 ENCOUNTER — Encounter: Payer: Self-pay | Admitting: Emergency Medicine

## 2021-05-27 ENCOUNTER — Emergency Department: Payer: BC Managed Care – PPO

## 2021-05-27 DIAGNOSIS — R519 Headache, unspecified: Secondary | ICD-10-CM | POA: Insufficient documentation

## 2021-05-27 DIAGNOSIS — E876 Hypokalemia: Secondary | ICD-10-CM | POA: Diagnosis not present

## 2021-05-27 DIAGNOSIS — R7401 Elevation of levels of liver transaminase levels: Secondary | ICD-10-CM | POA: Insufficient documentation

## 2021-05-27 DIAGNOSIS — H53149 Visual discomfort, unspecified: Secondary | ICD-10-CM | POA: Insufficient documentation

## 2021-05-27 DIAGNOSIS — D72829 Elevated white blood cell count, unspecified: Secondary | ICD-10-CM | POA: Insufficient documentation

## 2021-05-27 LAB — URINALYSIS, COMPLETE (UACMP) WITH MICROSCOPIC
Bacteria, UA: NONE SEEN
Bilirubin Urine: NEGATIVE
Glucose, UA: NEGATIVE mg/dL
Hgb urine dipstick: NEGATIVE
Ketones, ur: 20 mg/dL — AB
Leukocytes,Ua: NEGATIVE
Nitrite: NEGATIVE
Protein, ur: 30 mg/dL — AB
Specific Gravity, Urine: 1.012 (ref 1.005–1.030)
Squamous Epithelial / HPF: NONE SEEN (ref 0–5)
pH: 7 (ref 5.0–8.0)

## 2021-05-27 LAB — COMPREHENSIVE METABOLIC PANEL
ALT: 99 U/L — ABNORMAL HIGH (ref 0–44)
AST: 229 U/L — ABNORMAL HIGH (ref 15–41)
Albumin: 4.2 g/dL (ref 3.5–5.0)
Alkaline Phosphatase: 80 U/L (ref 38–126)
Anion gap: 15 (ref 5–15)
BUN: 12 mg/dL (ref 6–20)
CO2: 27 mmol/L (ref 22–32)
Calcium: 9.3 mg/dL (ref 8.9–10.3)
Chloride: 97 mmol/L — ABNORMAL LOW (ref 98–111)
Creatinine, Ser: 0.83 mg/dL (ref 0.44–1.00)
GFR, Estimated: >60 mL/min (ref >60)
Glucose, Bld: 130 mg/dL — ABNORMAL HIGH (ref 70–99)
Potassium: 3 mmol/L — ABNORMAL LOW (ref 3.5–5.1)
Sodium: 139 mmol/L (ref 135–145)
Total Bilirubin: 1.3 mg/dL — ABNORMAL HIGH (ref 0.3–1.2)
Total Protein: 8.2 g/dL — ABNORMAL HIGH (ref 6.5–8.1)

## 2021-05-27 LAB — CBC WITH DIFFERENTIAL/PLATELET
Abs Immature Granulocytes: 0.05 10*3/uL (ref 0.00–0.07)
Basophils Absolute: 0 10*3/uL (ref 0.0–0.1)
Basophils Relative: 0 %
Eosinophils Absolute: 0 10*3/uL (ref 0.0–0.5)
Eosinophils Relative: 0 %
HCT: 40 % (ref 36.0–46.0)
Hemoglobin: 14.3 g/dL (ref 12.0–15.0)
Immature Granulocytes: 0 %
Lymphocytes Relative: 4 %
Lymphs Abs: 0.6 10*3/uL — ABNORMAL LOW (ref 0.7–4.0)
MCH: 36.5 pg — ABNORMAL HIGH (ref 26.0–34.0)
MCHC: 35.8 g/dL (ref 30.0–36.0)
MCV: 102 fL — ABNORMAL HIGH (ref 80.0–100.0)
Monocytes Absolute: 0.9 10*3/uL (ref 0.1–1.0)
Monocytes Relative: 6 %
Neutro Abs: 13.9 10*3/uL — ABNORMAL HIGH (ref 1.7–7.7)
Neutrophils Relative %: 90 %
Platelets: 337 10*3/uL (ref 150–400)
RBC: 3.92 MIL/uL (ref 3.87–5.11)
RDW: 13.1 % (ref 11.5–15.5)
WBC: 15.5 10*3/uL — ABNORMAL HIGH (ref 4.0–10.5)
nRBC: 0 % (ref 0.0–0.2)

## 2021-05-27 LAB — LIPASE, BLOOD: Lipase: 27 U/L (ref 11–51)

## 2021-05-27 LAB — MAGNESIUM: Magnesium: 1.5 mg/dL — ABNORMAL LOW (ref 1.7–2.4)

## 2021-05-27 LAB — HCG, QUANTITATIVE, PREGNANCY: hCG, Beta Chain, Quant, S: 1 m[IU]/mL (ref <5)

## 2021-05-27 MED ORDER — POTASSIUM CHLORIDE CRYS ER 20 MEQ PO TBCR
40.0000 meq | EXTENDED_RELEASE_TABLET | Freq: Once | ORAL | Status: AC
  Administered 2021-05-27: 40 meq via ORAL
  Filled 2021-05-27: qty 2

## 2021-05-27 MED ORDER — MAGNESIUM OXIDE -MG SUPPLEMENT 400 (240 MG) MG PO TABS
800.0000 mg | ORAL_TABLET | ORAL | Status: AC
  Administered 2021-05-27: 800 mg via ORAL
  Filled 2021-05-27: qty 2

## 2021-05-27 MED ORDER — MAGNESIUM SULFATE 2 GM/50ML IV SOLN: 2.0000 g | Freq: Once | INTRAVENOUS | Status: DC

## 2021-05-27 MED ORDER — MAGNESIUM SULFATE 4 GM/100ML IV SOLN
4.0000 g | Freq: Once | INTRAVENOUS | Status: AC
Start: 2021-05-27 — End: 2021-05-27
  Administered 2021-05-27: 4 g via INTRAVENOUS
  Filled 2021-05-27: qty 100

## 2021-05-27 MED ORDER — LACTATED RINGERS IV BOLUS
1000.0000 mL | Freq: Once | INTRAVENOUS | Status: AC
  Administered 2021-05-27: 1000 mL via INTRAVENOUS

## 2021-05-27 MED ORDER — PROCHLORPERAZINE EDISYLATE 10 MG/2ML IJ SOLN
10.0000 mg | INTRAMUSCULAR | Status: AC
  Administered 2021-05-27: 10 mg via INTRAVENOUS
  Filled 2021-05-27: qty 2

## 2021-05-27 NOTE — ED Provider Triage Note (Signed)
Emergency Medicine Provider Triage Evaluation Note ? ?Vickie Mccarthy , a 41 y.o. female  was evaluated in triage.  Pt complains of diffuse abdominal discomfort and nausea and vomiting for the past 3 days patient denies fever and chills.  No medication changes.  She reports experiencing similar symptoms in the past. ? ?Review of Systems  ?Positive: Patient has diffuse abdominal pain and nausea/vomiting. ?Negative:  ? ?Physical Exam  ?There were no vitals taken for this visit. ?Gen:   Awake, no distress   ?Resp:  Normal effort  ?MSK:   Moves extremities without difficulty  ?Other:   ? ?Medical Decision Making  ?Medically screening exam initiated at 4:39 PM.  Appropriate orders placed.  Danyal Trentman was informed that the remainder of the evaluation will be completed by another provider, this initial triage assessment does not replace that evaluation, and the importance of remaining in the ED until their evaluation is complete. ? ? ?  ?Pia Mau [place], PA-C ?05/27/21 1640 ? ?

## 2021-05-27 NOTE — ED Triage Notes (Signed)
Pt to ED via [facility] with c/o N/V for the last 3 days with generalized abd pain. Reports that she is not pregnant. No fever or had any medication changes. Pt reports that she feels like she is going to pass out.  ?

## 2021-05-27 NOTE — ED Provider Notes (Signed)
? ?[facility] ?Provider Note ? ? ? Event Date/Time  ? First MD Initiated Contact with Patient 05/27/21 1924   ?  (approximate) ? ? ?History  ? ?Emesis ? ? ?HPI ? ?Vickie Mccarthy is a 41 y.o. female with a past medical history of migraine headaches, anxiety and depression as well as previous alcohol abuse with patient stating she has significantly cut down the last couple weeks now only drinking an alcoholic drink every other day who presents for evaluation of headache associate with nausea and vomiting.  She states this is typical of her usual migraine headaches but feels worse and has been going on for about a week.  She states she been taking some ibuprofen and medications prescribed by her PCP for headache but does not feel this helped.  She denies any vision changes, vertigo, earache, sore throat, chest pain, cough, shortness of breath, abdominal pain, diarrhea, burning with urination, rash or any focal extremity weakness numbness or tingling.  She does feel like the headache is in the back of her head.  No back pain.  She endorses THC use but denies any other illicit drug use.  No other acute concerns at this time.  She does endorse photophobia. ? ?  ? ? ?Physical Exam  ?Triage Vital Signs: ?ED Triage Vitals  ?Enc Vitals Group  ?   BP 05/27/21 1640 (!) 128/105  ?   Pulse Rate 05/27/21 1640 79  ?   Resp 05/27/21 1640 20  ?   Temp 05/27/21 1640 98.5 ?F (36.9 ?C)  ?   Temp src --   ?   SpO2 05/27/21 1640 98 %  ?   Weight 05/27/21 1641 145 lb (65.8 kg)  ?   Height 05/27/21 1641 4\' 10"  (1.473 m)  ?   Head Circumference --   ?   Peak Flow --   ?   Pain Score 05/27/21 1641 7  ?   Pain Loc --   ?   Pain Edu? --   ?   Excl. in GC? --   ? ? ?Most recent vital signs: ?Vitals:  ? 05/27/21 1640  ?BP: (!) 128/105  ?Pulse: 79  ?Resp: 20  ?Temp: 98.5 ?F (36.9 ?C)  ?SpO2: 98%  ? ? ?General: Awake, appears uncomfortable. ?CV:  Good peripheral perfusion.  2+ radial pulses.  No murmurs. ?Resp:  Normal  effort.  ?Abd:  No distention.  Soft throughout.  No CVA tenderness. ?Other:  Cranial nerves II through XII grossly intact.  No pronator drift.  No finger dysmetria.  Symmetric 5/5 strength of all extremities.  Sensation intact to light touch in all extremities.  Unremarkable unassisted gait. ? ? ? ?ED Results / Procedures / Treatments  ?Labs ?(all labs ordered are listed, but only abnormal results are displayed) ?Labs Reviewed  ?CBC WITH DIFFERENTIAL/PLATELET - Abnormal; Notable for the following components:  ?    Result Value  ? WBC 15.5 (*)   ? MCV 102.0 (*)   ? MCH 36.5 (*)   ? Neutro Abs 13.9 (*)   ? Lymphs Abs 0.6 (*)   ? All other components within normal limits  ?COMPREHENSIVE METABOLIC PANEL - Abnormal; Notable for the following components:  ? Potassium 3.0 (*)   ? Chloride 97 (*)   ? Glucose, Bld 130 (*)   ? Total Protein 8.2 (*)   ? AST 229 (*)   ? ALT 99 (*)   ? Total Bilirubin 1.3 (*)   ?  All other components within normal limits  ?MAGNESIUM - Abnormal; Notable for the following components:  ? Magnesium 1.5 (*)   ? All other components within normal limits  ?LIPASE, BLOOD  ?HCG, QUANTITATIVE, PREGNANCY  ?URINALYSIS, COMPLETE (UACMP) WITH MICROSCOPIC  ? ? ? ?EKG ? ?ECG is remarkable sinus rhythm with a ventricular rate of 80 and some artifact in lead III without other clear evidence of acute ischemia or significant arrhythmia. ? ? ?RADIOLOGY ? ?CT head on my interpretation without evidence of intracranial hemorrhage, ischemia, edema, mass effect or other acute process.  I also reviewed radiologist interpretation and agree with their findings of same. ? ?PROCEDURES: ? ?Critical Care performed: No ? ?Procedures ? ? ? ?MEDICATIONS ORDERED IN ED: ?Medications  ?magnesium sulfate IVPB 4 g 100 mL (4 g Intravenous New Bag/Given 05/27/21 2110)  ?lactated ringers bolus 1,000 mL (1,000 mLs Intravenous New Bag/Given 05/27/21 2057)  ?prochlorperazine (COMPAZINE) injection 10 mg (10 mg Intravenous Given 05/27/21 2110)   ?potassium chloride SA (KLOR-CON M) CR tablet 40 mEq (40 mEq Oral Given 05/27/21 2056)  ? ? ? ?IMPRESSION / MDM / ASSESSMENT AND PLAN / ED COURSE  ?I reviewed the triage vital signs and the nursing notes. ?             ?               ? ?Differential diagnosis includes, but is not limited to migraine headache, infectious gastritis, pancreatitis, atypical ACS, anemia and other metabolic derangements.  She denies any abdominal pain and has no tenderness to suggest cholecystitis, appendicitis or other acute abdominal process.  She states that overall her headache came on gradually and feels very similar to prior migraine headaches, has been more persistent and a little worse today, fairly low suspicion for an SAH at this time.  She has no focal neurological deficits to suggest a CVA. ? ?CT head is unremarkable. ? ?CMP is remarkable for K of 3, AST of 229 and ALT of 99 without evidence of significant cholestatic process or any other significant electrolyte or metabolic derangements.  I suspect this is from patient's history of chronic alcohol abuse.  Lipase not consistent with acute pancreatitis.  Pregnancy test is negative.  Magnesium is 1.5.  CBC shows WBC count of 15.5 without evidence of acute anemia and normal platelets.  Somewhat nonspecific but certainly could be related to her nausea and vomiting over the last couple days. ? ?Patient is feeling much better on my reassessment.  I think she is stable for discharge with outpatient follow-up.  Discussed avoiding any alcohol in the future and having her electrolytes and liver function rechecked in 2 or 3 days.  Advised to return immediately to the emergency room for any new or worsening of symptoms.  Discharged in stable condition.  Strict return precautions advised and discussed. ? ?  ? ? ?FINAL CLINICAL IMPRESSION(S) / ED DIAGNOSES  ? ?Final diagnoses:  ?Nonintractable headache, unspecified chronicity pattern, unspecified headache type  ?Hypomagnesemia  ?Hypokalemia   ?Transaminitis  ? ? ? ?Rx / DC Orders  ? ?ED Discharge Orders   ? ? None  ? ?  ? ? ? ?Note:  This document was prepared using Dragon voice recognition software and may include unintentional dictation errors. ?  ?Gilles Chiquito, MD ?05/27/21 2150 ? ?

## 2021-05-27 NOTE — ED Notes (Signed)
First Nurse Note:  Pt to ED via [facility] from the [place] for nausea and vomiting. VSS. Pt is in NAD. Pt was given 4 mg of zofran ?

## 2022-11-09 IMAGING — CT CT HEAD W/O CM
4 series · 17 of 47 positions shown, 19 images · non-contrast
Comparison: None.

CLINICAL DATA: Sudden onset headaches



[Series 2: head wo · axial · 0.42mm/px · z∈[-133,-23]mm · 7 of 30 slices shown, 9 images]
[im 4/30  brain]
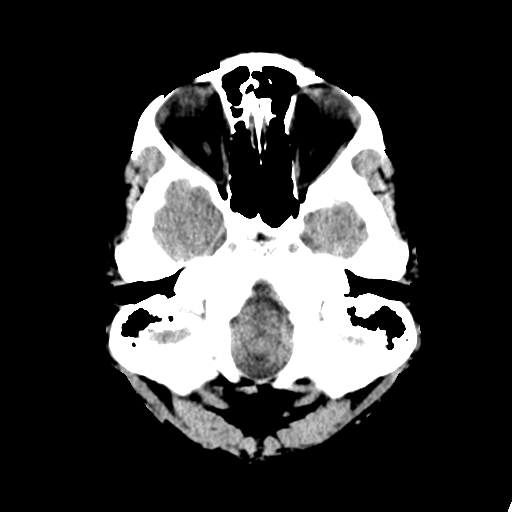
[im 4/30  bone]
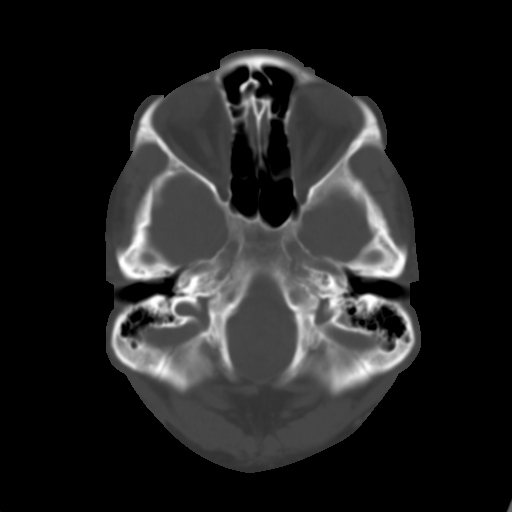
[im 8/30  brain]
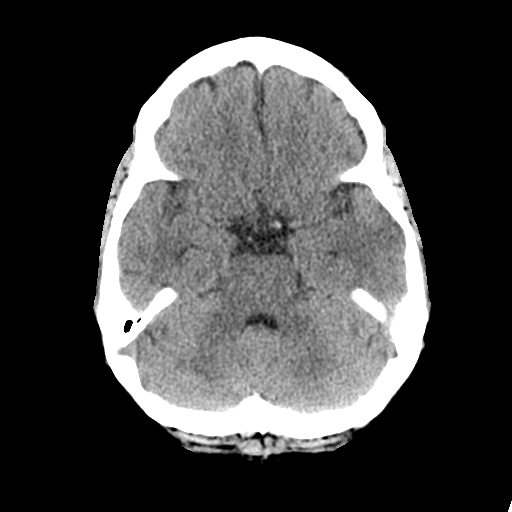
[im 11/30  brain]
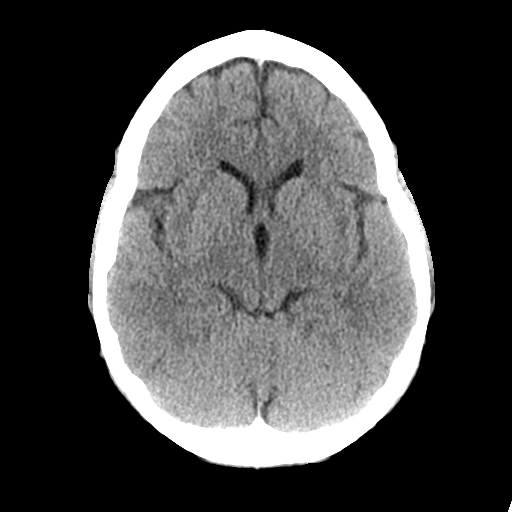
[im 15/30  brain]
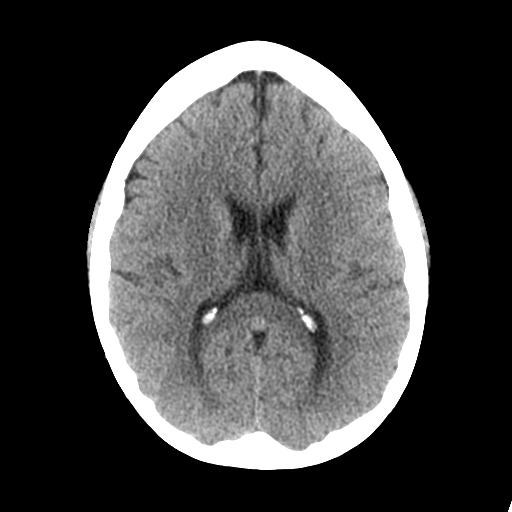
[im 19/30  brain]
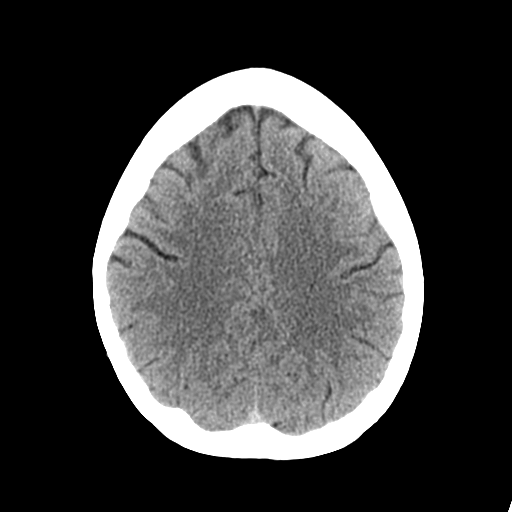
[im 19/30  bone]
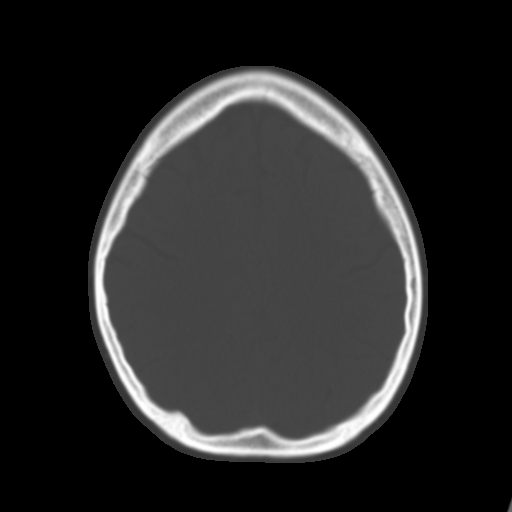
[im 22/30  brain]
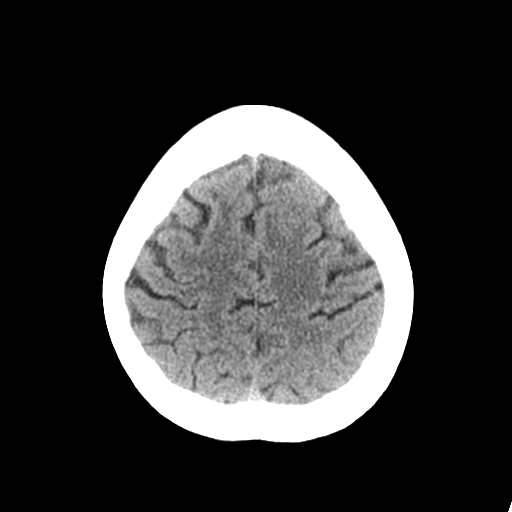
[im 26/30  brain]
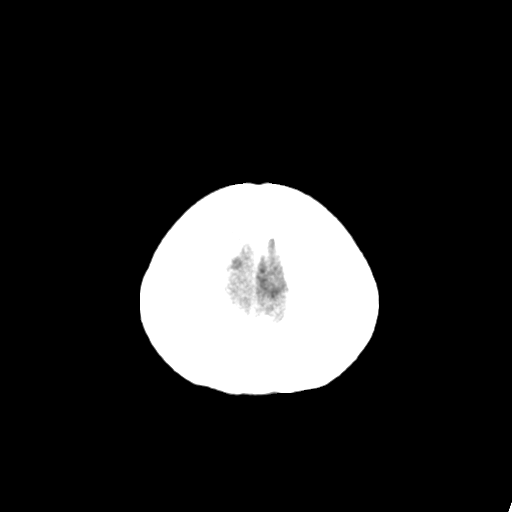

[Series 3: head bone · axial · 0.42mm/px · z∈[-134,-84]mm · 4 of 74 slices shown]
[im 8/74  bone]
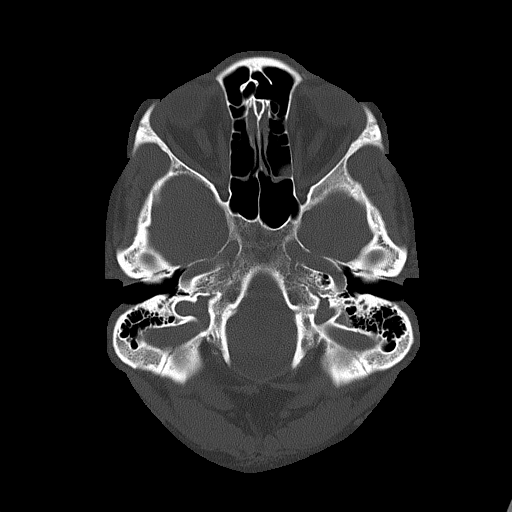
[im 15/74  bone]
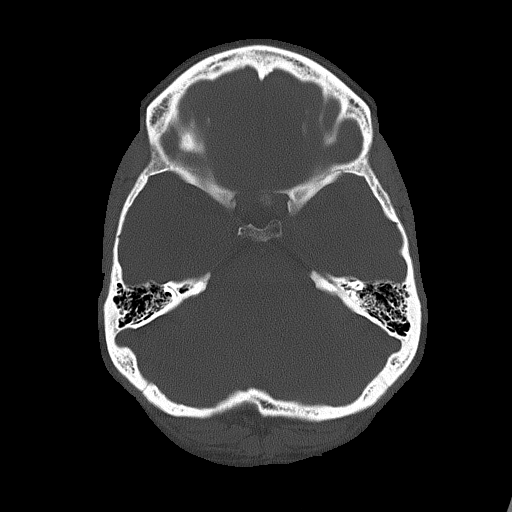
[im 22/74  bone]
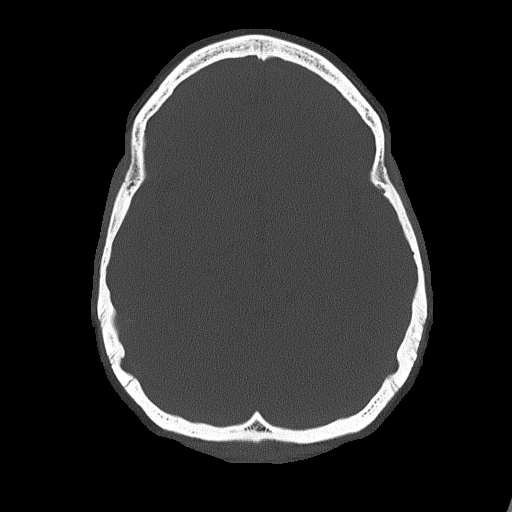
[im 33/74  bone]
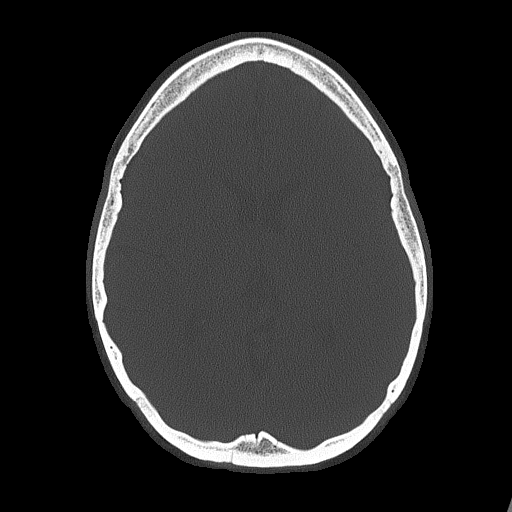

[Series 4: coronal soft tissue · coronal · 0.31mm/px · 3 of 65 slices shown]
[im 22/65  brain]
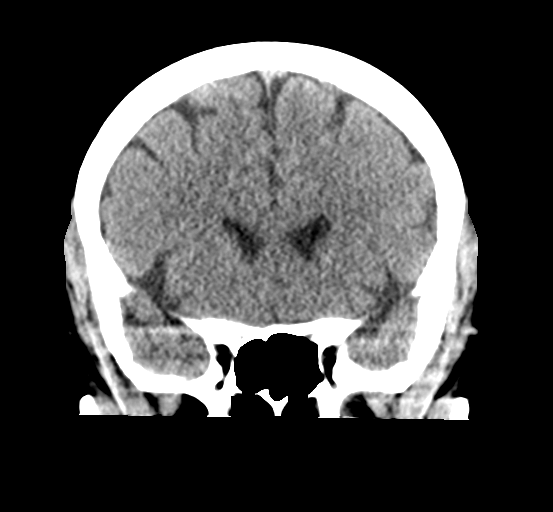
[im 29/65  brain]
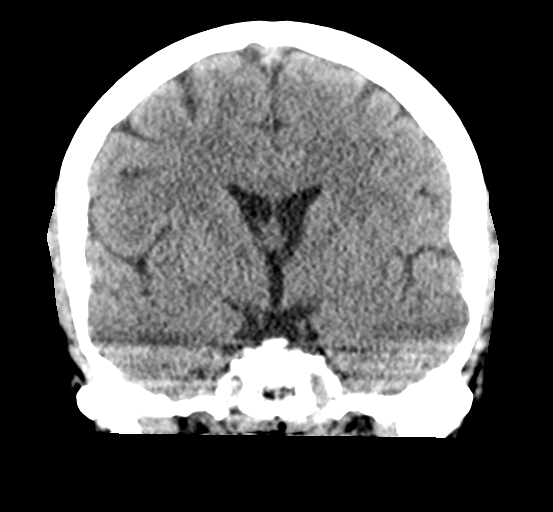
[im 36/65  brain]
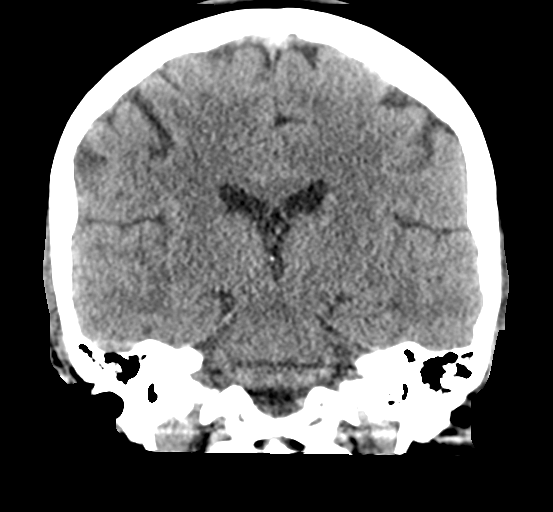

[Series 5: sagittal soft tissue · sagittal · 0.31mm/px · 3 of 51 slices shown]
[im 17/51  brain]
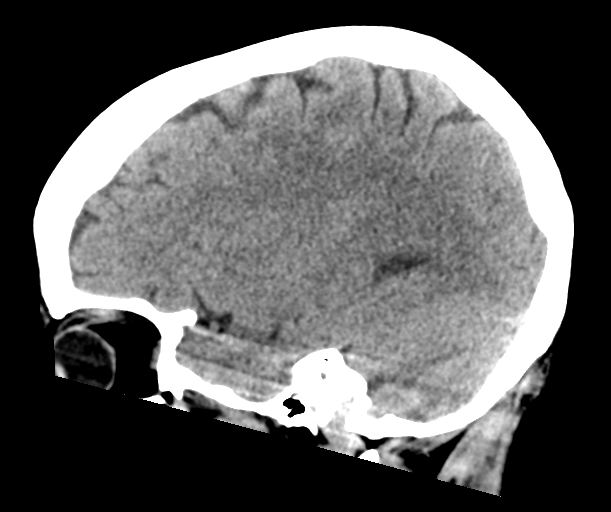
[im 26/51  brain]
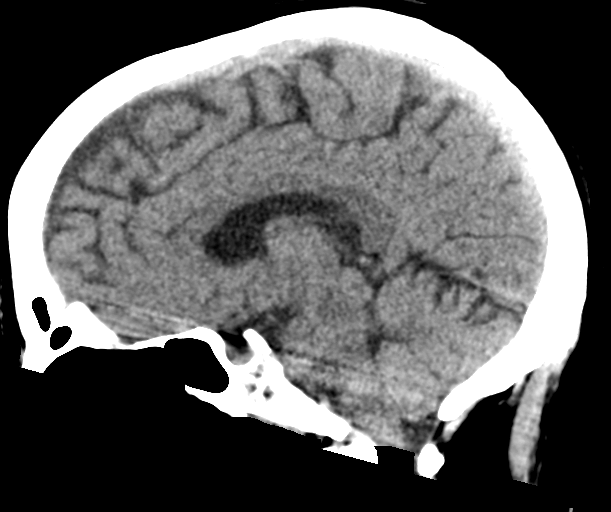
[im 34/51  brain]
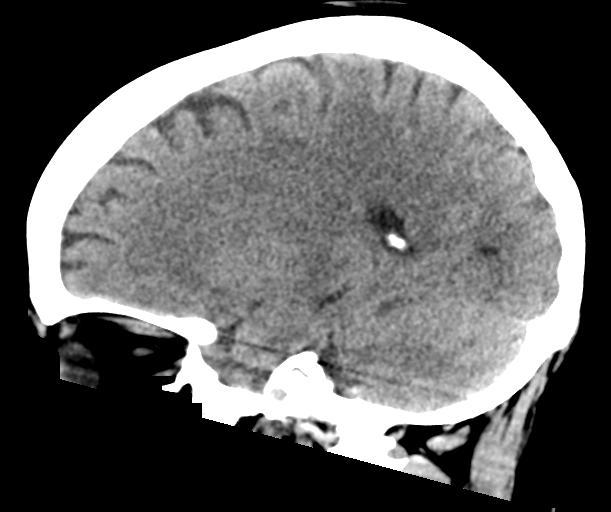

[17 of 47 positions shown; findings below may reference images not displayed]

FINDINGS: Brain: No evidence of acute infarction, hemorrhage, hydrocephalus,
extra-axial collection or mass lesion/mass effect.

Vascular: No hyperdense vessel or unexpected calcification.

Skull: Normal. Negative for fracture or focal lesion.

Sinuses/Orbits: No acute finding.

Other: None.
IMPRESSION: No acute intracranial abnormality noted.
